# Patient Record
Sex: Male | Born: 1985 | Race: Black or African American | Hispanic: No | Marital: Single | Smoking: Never smoker
Health system: Southern US, Community
[De-identification: ages and names within clinical notes are randomized; demographics above are authoritative.]

---
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
## 2008-12-24 ENCOUNTER — Emergency Department (HOSPITAL_COMMUNITY): Admission: EM | Admit: 2008-12-24 | Discharge: 2008-12-24 | Payer: Self-pay | Admitting: Emergency Medicine

## 2009-03-14 ENCOUNTER — Emergency Department (HOSPITAL_COMMUNITY): Admission: EM | Admit: 2009-03-14 | Discharge: 2009-03-14 | Payer: Self-pay | Admitting: Emergency Medicine

## 2010-07-04 LAB — GC/CHLAMYDIA PROBE AMP, GENITAL
Chlamydia, DNA Probe: NEGATIVE
GC Probe Amp, Genital: NEGATIVE

## 2021-10-13 ENCOUNTER — Ambulatory Visit (HOSPITAL_COMMUNITY)
Admission: RE | Admit: 2021-10-13 | Discharge: 2021-10-13 | Disposition: A | Discharge status: Home / Self Care | Payer: Self-pay | Source: Ambulatory Visit | Attending: Internal Medicine | Admitting: Internal Medicine

## 2021-10-13 ENCOUNTER — Encounter (HOSPITAL_COMMUNITY): Payer: Self-pay

## 2021-10-13 VITALS — BP 120/73 | HR 71 | Temp 97.9°F | Resp 16 | Ht 74.0 in | Wt 260.0 lb

## 2021-10-13 DIAGNOSIS — Z113 Encounter for screening for infections with a predominantly sexual mode of transmission: Secondary | ICD-10-CM | POA: Insufficient documentation

## 2021-10-13 DIAGNOSIS — R369 Urethral discharge, unspecified: Secondary | ICD-10-CM | POA: Insufficient documentation

## 2021-10-13 NOTE — ED Triage Notes (Signed)
Patient c/o penile discharge x 2 days.  Patient denies dysuria, testicular pain, penile pain, or ABD pain.   Patient endorses "yellowish paste" discharge.   Patient endorses 1 new sexual partner.   Patient hasn't taken any medications for symptoms.

## 2021-10-13 NOTE — ED Provider Notes (Signed)
[facility]    CSN: [id number] Arrival date & time: 10/13/21  1048      History   Chief Complaint Chief Complaint  Patient presents with   Penile Discharge   APPT 1100    HPI Andrew Webster is a 36 y.o. male.   Patient presents with yellow penile discharge that has been present for 2 to 3 days.  Denies any associated urinary burning, urinary frequency, testicular pain, abdominal pain, fever.  He states that his new sexual partner has had similar symptoms.  Denies any confirmed exposure to STD.  Patient does not want testing for HIV or syphilis.   Penile Discharge    History reviewed. No pertinent past medical history.  There are no problems to display for this patient.   History reviewed. No pertinent surgical history.     Home Medications    Prior to Admission medications   Not on File    Family History No family history on file.  Social History Social History   Tobacco Use   Smoking status: Never   Smokeless tobacco: Never  Vaping Use   Vaping Use: Never used  Substance Use Topics   Alcohol use: Not Currently   Drug use: Never     Allergies   Patient has no known allergies.   Review of Systems Review of Systems Per HPI  Physical Exam Triage Vital Signs ED Triage Vitals  Enc Vitals Group     BP 10/13/21 1115 120/73     Pulse Rate 10/13/21 1115 71     Resp 10/13/21 1115 16     Temp 10/13/21 1115 97.9 F (36.6 C)     Temp Source 10/13/21 1115 Oral     SpO2 10/13/21 1115 99 %     Weight 10/13/21 1118 260 lb (117.9 kg)     Height 10/13/21 1118 6\' 2"  (1.88 m)     Head Circumference --      Peak Flow --      Pain Score 10/13/21 1118 0     Pain Loc --      Pain Edu? --      Excl. in GC? --    No data found.  Updated Vital Signs BP 120/73 (BP Location: Left Arm)   Pulse 71   Temp 97.9 F (36.6 C) (Oral)   Resp 16   Ht 6\' 2"  (1.88 m)   Wt 260 lb (117.9 kg)   SpO2 99%   BMI 33.38 kg/m   Visual Acuity Right Eye  Distance:   Left Eye Distance:   Bilateral Distance:    Right Eye Near:   Left Eye Near:    Bilateral Near:     Physical Exam Constitutional:      General: He is not in acute distress.    Appearance: Normal appearance. He is not toxic-appearing or diaphoretic.  HENT:     Head: Normocephalic and atraumatic.  Eyes:     Extraocular Movements: Extraocular movements intact.     Conjunctiva/sclera: Conjunctivae normal.  Pulmonary:     Effort: Pulmonary effort is normal.  Genitourinary:    Comments: Deferred with shared decision making.  Self swab performed. Neurological:     General: No focal deficit present.     Mental Status: He is alert and oriented to person, place, and time. Mental status is at baseline.  Psychiatric:        Mood and Affect: Mood normal.        Behavior: Behavior  normal.        Thought Content: Thought content normal.        Judgment: Judgment normal.      UC Treatments / Results  Labs (all labs ordered are listed, but only abnormal results are displayed) Labs Reviewed  CYTOLOGY, (ORAL, ANAL, URETHRAL) ANCILLARY ONLY    EKG   Radiology No results found.  Procedures Procedures (including critical care time)  Medications Ordered in UC Medications - No data to display  Initial Impression / Assessment and Plan / UC Course  I have reviewed the triage vital signs and the nursing notes.  Pertinent labs & imaging results that were available during my care of the patient were reviewed by me and considered in my medical decision making (see chart for details).     Cytology swab pending.  Due to no confirmed STD exposure, will await cytology swab results for treatment.  Patient to refrain from sexual activity until test results and treatment are complete.  Discussed return precautions.  Patient verbalized understanding and was agreeable with plan. Final Clinical Impressions(s) / UC Diagnoses   Final diagnoses:  Penile discharge  Screening  examination for venereal disease     Discharge Instructions      Your STD test is pending. We will call if results are positive and treat as appropriate. Please refrain from sexual activity until test results and treatment are complete.       ED Prescriptions   None    PDMP not reviewed this encounter.   Gustavus Bryant, [place] 10/13/21 1154

## 2021-10-13 NOTE — Discharge Instructions (Signed)
Your STD test is pending.  We will call if results are positive and treat as appropriate.  Please refrain from sexual activity until test results and treatment are complete. 

## 2021-10-17 ENCOUNTER — Ambulatory Visit (HOSPITAL_COMMUNITY)
Admission: EM | Admit: 2021-10-17 | Discharge: 2021-10-17 | Disposition: A | Discharge status: Home / Self Care | Payer: Self-pay

## 2021-10-18 LAB — CYTOLOGY, (ORAL, ANAL, URETHRAL) ANCILLARY ONLY
Chlamydia: NEGATIVE
Comment: NEGATIVE
Comment: NEGATIVE
Comment: NORMAL
Neisseria Gonorrhea: POSITIVE — AB
Trichomonas: POSITIVE — AB

## 2021-10-19 ENCOUNTER — Telehealth (HOSPITAL_COMMUNITY): Payer: Self-pay | Admitting: Emergency Medicine

## 2021-10-19 NOTE — Telephone Encounter (Signed)
OPened in error.

## 2023-05-23 ENCOUNTER — Emergency Department (HOSPITAL_COMMUNITY): Payer: Self-pay

## 2023-05-23 ENCOUNTER — Ambulatory Visit
Admission: EM | Admit: 2023-05-23 | Discharge: 2023-05-23 | Disposition: A | Discharge status: Home / Self Care | Payer: Self-pay | Attending: Family Medicine | Admitting: Family Medicine

## 2023-05-23 ENCOUNTER — Emergency Department (HOSPITAL_COMMUNITY)
Admission: EM | Admit: 2023-05-23 | Discharge: 2023-05-23 | Disposition: A | Discharge status: Home / Self Care | Payer: Self-pay | Attending: Emergency Medicine | Admitting: Emergency Medicine

## 2023-05-23 ENCOUNTER — Encounter (HOSPITAL_COMMUNITY): Payer: Self-pay

## 2023-05-23 ENCOUNTER — Other Ambulatory Visit: Payer: Self-pay

## 2023-05-23 DIAGNOSIS — S61452A Open bite of left hand, initial encounter: Secondary | ICD-10-CM

## 2023-05-23 DIAGNOSIS — S60413A Abrasion of left middle finger, initial encounter: Secondary | ICD-10-CM | POA: Insufficient documentation

## 2023-05-23 DIAGNOSIS — S60417A Abrasion of left little finger, initial encounter: Secondary | ICD-10-CM | POA: Insufficient documentation

## 2023-05-23 DIAGNOSIS — Z23 Encounter for immunization: Secondary | ICD-10-CM | POA: Insufficient documentation

## 2023-05-23 DIAGNOSIS — T148XXA Other injury of unspecified body region, initial encounter: Secondary | ICD-10-CM

## 2023-05-23 DIAGNOSIS — S80811A Abrasion, right lower leg, initial encounter: Secondary | ICD-10-CM | POA: Insufficient documentation

## 2023-05-23 DIAGNOSIS — R42 Dizziness and giddiness: Secondary | ICD-10-CM

## 2023-05-23 DIAGNOSIS — W540XXA Bitten by dog, initial encounter: Secondary | ICD-10-CM | POA: Insufficient documentation

## 2023-05-23 DIAGNOSIS — Y9301 Activity, walking, marching and hiking: Secondary | ICD-10-CM | POA: Insufficient documentation

## 2023-05-23 MED ORDER — RABIES IMMUNE GLOBULIN 150 UNIT/ML IM INJ
20.0000 [IU]/kg | INJECTION | Freq: Once | INTRAMUSCULAR | Status: DC
  Filled 2023-05-23: qty 16

## 2023-05-23 MED ORDER — AMOXICILLIN-POT CLAVULANATE 875-125 MG PO TABS
1.0000 | ORAL_TABLET | Freq: Once | ORAL | Status: AC
  Administered 2023-05-23: 1 via ORAL
  Filled 2023-05-23: qty 1

## 2023-05-23 MED ORDER — AMOXICILLIN-POT CLAVULANATE 875-125 MG PO TABS: 1.0000 | ORAL_TABLET | Freq: Two times a day (BID) | ORAL | 0 refills | Status: AC

## 2023-05-23 MED ORDER — LIDOCAINE-EPINEPHRINE (PF) 2 %-1:200000 IJ SOLN
20.0000 mL | Freq: Once | INTRAMUSCULAR | Status: AC
  Administered 2023-05-23: 4 mL
  Filled 2023-05-23: qty 20

## 2023-05-23 MED ORDER — HYDROCODONE-ACETAMINOPHEN 5-325 MG PO TABS: 1.0000 | ORAL_TABLET | Freq: Four times a day (QID) | ORAL | 0 refills | Status: AC | PRN

## 2023-05-23 MED ORDER — NAPROXEN 500 MG PO TABS: 500.0000 mg | ORAL_TABLET | Freq: Two times a day (BID) | ORAL | 0 refills | Status: AC

## 2023-05-23 MED ORDER — TETANUS-DIPHTH-ACELL PERTUSSIS 5-2.5-18.5 LF-MCG/0.5 IM SUSY
0.5000 mL | PREFILLED_SYRINGE | Freq: Once | INTRAMUSCULAR | Status: AC
  Administered 2023-05-23: 0.5 mL via INTRAMUSCULAR
  Filled 2023-05-23: qty 0.5

## 2023-05-23 MED ORDER — BACITRACIN ZINC 500 UNIT/GM EX OINT
TOPICAL_OINTMENT | Freq: Once | CUTANEOUS | Status: AC
  Administered 2023-05-23: 1 via TOPICAL

## 2023-05-23 MED ORDER — IBUPROFEN 800 MG PO TABS
800.0000 mg | ORAL_TABLET | Freq: Once | ORAL | Status: AC
  Administered 2023-05-23: 800 mg via ORAL
  Filled 2023-05-23: qty 1

## 2023-05-23 MED ORDER — ONDANSETRON 4 MG PO TBDP
4.0000 mg | ORAL_TABLET | Freq: Once | ORAL | Status: AC
  Administered 2023-05-23: 4 mg via ORAL
  Filled 2023-05-23: qty 1

## 2023-05-23 MED ORDER — MORPHINE SULFATE (PF) 4 MG/ML IV SOLN
4.0000 mg | Freq: Once | INTRAVENOUS | Status: AC
  Administered 2023-05-23: 4 mg via INTRAMUSCULAR
  Filled 2023-05-23: qty 1

## 2023-05-23 MED ORDER — RABIES VACCINE, PCEC IM SUSR
1.0000 mL | Freq: Once | INTRAMUSCULAR | Status: AC
  Administered 2023-05-23: 1 mL via INTRAMUSCULAR
  Filled 2023-05-23: qty 1

## 2023-05-23 NOTE — ED Triage Notes (Signed)
Pt arrives via EMS from [facility]. Pt was walking and a stray dog came up and bit him on his left and and right lower leg. Dressing is applied to left hand, appears that bleeding is controlled. Pt not utd on a tetanus shot.

## 2023-05-23 NOTE — ED Provider Notes (Signed)
Patient seen briefly at triage.  He sustained a dog bite to his left hand in right leg this morning earlier.  The dog is a stray.  On exam his blood pressure is 102/69 which is a good bit different from usual and he is having some dizziness at this time.  The hand has a bite wound on the palmar surface and on the dorsum of the left hand.  I cannot see if there is any tendon injury.  He is unable to flex and extend his fingers at this time.  There is a good bit of swelling of the hand.  There is also a bite wound on the anterior right lower leg.  With his deep hand wound, I think he needs evaluation in the emergency room.  His blood pressure is also soft, though I do not think he has had a copious amount of bleeding.  I discussed with him and his significant other going to the emergency room.  They are unable to arrive there on their own in a nonurgent ambulance is called for transport.     Zenia Resides, MD 05/23/23 [phone]

## 2023-05-23 NOTE — ED Notes (Signed)
Patient was told the effect of not taking the Rabies Immunoglobolin

## 2023-05-23 NOTE — ED Notes (Signed)
Patient is being discharged from the Urgent Care and sent to the Emergency Department via Ambulance (Non Emergent, Per request) . Per Dr. Rowland Lathe, patient is in need of higher level of care due to Dog Bite (extend/place of injury). Patient is aware and verbalizes understanding of plan of care.  Vitals:   05/23/23 1210  BP: 102/69  Pulse: (!) 105  Resp: 18  Temp: 97.9 F (36.6 C)  SpO2: 95%

## 2023-05-23 NOTE — ED Notes (Addendum)
Patient refuse rabies immune Globulin to be placed in his wound and he did not want in his muscles. Dr Denton Lank was told.

## 2023-05-23 NOTE — ED Provider Notes (Signed)
 [place] EMERGENCY DEPARTMENT AT [facility] Provider Note   CSN: [id number] Arrival date & time: 05/23/23  1258     History  Chief Complaint  Patient presents with   Animal Bite    Andrew Webster is a 38 y.o. male.  Andrew Webster is a 38 y.o. male who is otherwise healthy, presents to the ED from urgent care for evaluation of a dog bite.  Patient reports bite wounds to his left hand and right lower leg.  He reports he was walking down the street with his girlfriend when a stray dog came up as he tried to shoot the dog away it bit his right lower leg and then bit onto his left hand and would not let go as he tried to pull his hand away.  Sent from urgent care for further evaluation of bite wounds.  Unsure of last tetanus vaccination and they do not know of the dog's rabies status as there was no owner or other people nearby.   Animal Bite Associated symptoms: no fever        Home Medications Prior to Admission medications   Medication Sig Start Date End Date Taking? Authorizing Provider  amoxicillin-clavulanate (AUGMENTIN) 875-125 MG tablet Take 1 tablet by mouth every 12 (twelve) hours. 05/23/23  Yes Rosezella Rumpf, PA-C  HYDROcodone-acetaminophen (NORCO/VICODIN) 5-325 MG tablet Take 1 tablet by mouth every 6 (six) hours as needed. 05/23/23  Yes Rosezella Rumpf, PA-C  naproxen (NAPROSYN) 500 MG tablet Take 1 tablet (500 mg total) by mouth 2 (two) times daily. 05/23/23  Yes Rosezella Rumpf, PA-C      Allergies    Patient has no known allergies.    Review of Systems   Review of Systems  Constitutional:  Negative for chills and fever.  Skin:  Positive for wound.    Physical Exam Updated Vital Signs BP 128/89 (BP Location: Right Arm)   Pulse 82   Temp 98.4 F (36.9 C) (Oral)   Resp 16   SpO2 100%  Physical Exam Vitals and nursing note reviewed.  Constitutional:      General: He is not in acute distress.    Appearance: Normal appearance. He is  well-developed. He is not ill-appearing or diaphoretic.  HENT:     Head: Normocephalic and atraumatic.  Eyes:     General:        Right eye: No discharge.        Left eye: No discharge.  Pulmonary:     Effort: Pulmonary effort is normal. No respiratory distress.  Musculoskeletal:     Comments: Left hand with 1-1/2 cm jagged laceration to the center of the palm with some subcutaneous tissue visible but no visible tendon.  Patient with 1 cm laceration to the dorsum of the hand in between the third and fourth MCPs.  Some small abrasions to the third and fourth fingers.  Patient has some swelling to the hand reducing range of motion but he is able to move all fingers has normal cap refill in all fingers with 2+ radial pulses and sensation intact throughout the hand.  On the posterior medial right calf patient has a small abrasion this does not appear to be a puncture wound there is no surrounding tenderness or redness.  Neurological:     Mental Status: He is alert and oriented to person, place, and time.     Coordination: Coordination normal.  Psychiatric:        Mood  and Affect: Mood normal.        Behavior: Behavior normal.    ED Results / Procedures / Treatments   Labs (all labs ordered are listed, but only abnormal results are displayed) Labs Reviewed - No data to display  EKG None  Radiology DG Tibia/Fibula Right Result Date: 05/23/2023 CLINICAL DATA:  Right lower leg pain after dog bite. EXAM: RIGHT TIBIA AND FIBULA - 2 VIEW COMPARISON:  None Available. FINDINGS: There is no evidence of fracture or other focal bone lesions. Soft tissues are unremarkable. IMPRESSION: Negative. Electronically Signed   By: Obie Dredge M.D.   On: 05/23/2023 14:18   DG Hand Complete Left Result Date: 05/23/2023 CLINICAL DATA:  Left hand pain after dog bite. EXAM: LEFT HAND - COMPLETE 3+ VIEW COMPARISON:  None Available. FINDINGS: There is no evidence of fracture or dislocation. There is no evidence  of arthropathy or other focal bone abnormality. Mild diffuse soft tissue swelling. No radiopaque foreign body identified. IMPRESSION: 1. Mild diffuse soft tissue swelling. No acute osseous abnormality or radiopaque foreign body. Electronically Signed   By: Obie Dredge M.D.   On: 05/23/2023 14:17    Procedures Irrigation  Date/Time: 05/29/2023 7:59 PM  Performed by: Rosezella Rumpf, PA-C Authorized by: Rosezella Rumpf, PA-C  Consent: Verbal consent obtained. Risks and benefits: risks, benefits and alternatives were discussed Consent given by: patient Patient understanding: patient states understanding of the procedure being performed Patient identity confirmed: verbally with patient Local anesthesia used: yes Anesthesia: local infiltration  Anesthesia: Local anesthesia used: yes Local Anesthetic: lidocaine 2% with epinephrine  Sedation: Patient sedated: no  Patient tolerance: patient tolerated the procedure well with no immediate complications Comments: Dog bite wounds to the dorsum and palmar surface of the left hand irrigated with 2 L of normal saline full depth of wound explored without evidence of tendon involvement       Medications Ordered in ED Medications  rabies immune globulin (HYPERRAB/KEDRAB) injection 2,325 Units (has no administration in time range)  rabies vaccine (RABAVERT) injection 1 mL (has no administration in time range)  ondansetron (ZOFRAN-ODT) disintegrating tablet 4 mg (4 mg Oral Given 05/23/23 1442)  morphine (PF) 4 MG/ML injection 4 mg (4 mg Intramuscular Given 05/23/23 1443)  lidocaine-EPINEPHrine (XYLOCAINE W/EPI) 2 %-1:200000 (PF) injection 20 mL (4 mLs Infiltration Given 05/23/23 1514)  Tdap (BOOSTRIX) injection 0.5 mL (0.5 mLs Intramuscular Given 05/23/23 1546)  amoxicillin-clavulanate (AUGMENTIN) 875-125 MG per tablet 1 tablet (1 tablet Oral Given 05/23/23 1544)  ibuprofen (ADVIL) tablet 800 mg (800 mg Oral Given 05/23/23 1544)  bacitracin  ointment (1 Application Topical Given 05/23/23 1527)    ED Course/ Medical Decision Making/ A&P                                 Medical Decision Making Amount and/or Complexity of Data Reviewed Radiology: ordered.  Risk OTC drugs. Prescription drug management.   38 year old male with dog bite to the left hand and abrasion to the right calf.  X-rays of the hand and tib-fib obtained and viewed and interpreted without evidence of fracture or foreign body.  Wounds were copiously irrigated cleaned and explored without evidence of tendon involvement.  Given that wounds were small and at high risk for infection they were not sutured closed and will be allowed to heal by secondary intention.  Bacitracin and dressings applied to the hand and right calf.  Ordered tetanus  and rabies vaccination as well as rabies immunoglobulin and will place patient on Augmentin for infectious prophylaxis from dog bite.  Discussed NSAIDs for pain management and short course of hydrocodone as needed for severe breakthrough pain cautioned patient that this medication can cause drowsiness.  Discussed strict return precautions with the patient and provided with letter explaining vaccination schedule to complete rabies series.  Patient and significant other expressed understanding and agreement with plan.  At this time there does not appear to be any evidence of an acute emergency medical condition requiring further emergent evaluation and the patient appears stable for discharge with appropriate outpatient follow up. Diagnosis and return precautions discussed with patient who verbalizes understanding and is agreeable to discharge.           Final Clinical Impression(s) / ED Diagnoses Final diagnoses:  Animal bite    Rx / DC Orders ED Discharge Orders          Ordered    amoxicillin-clavulanate (AUGMENTIN) 875-125 MG tablet  Every 12 hours        05/23/23 1538    naproxen (NAPROSYN) 500 MG tablet  2 times  daily        05/23/23 1538    HYDROcodone-acetaminophen (NORCO/VICODIN) 5-325 MG tablet  Every 6 hours PRN        05/23/23 1538              Velda Shell 05/29/23 2003    Terrilee Files, MD 05/30/23 1010

## 2023-05-23 NOTE — Discharge Instructions (Addendum)
Your x-rays did not show any fractures or foreign body.  Keep wounds wrapped and covered.  Wash with plain soap and water or saline wound spray at least once a day and then recover. Do not use hydrogen peroxide or alcohol over the wounds.  To help with swelling elevate the hand and apply ice for 20 minutes at a time.  If you notice significantly worsened swelling redness or drainage from the wounds you should return to the emergency department for further evaluation.  Dog bites are at high risk for infection.  Take Augmentin twice daily for the next 7 days.  To treat pain use Naprosyn twice daily, this is a pain and anti-inflammatory medication.  For severe breakthrough pain you can use prescribed hydrocodone 1 tablet every 6 hours.  Do not drive while using this medication.  You will need to follow-up at urgent care to complete the rest of your rabies vaccination series please reference the letter on your paperwork today that shows the dates that you need to follow-up for additional vaccinations.  Your tetanus vaccine was also updated today.

## 2023-05-23 NOTE — ED Triage Notes (Signed)
"  We were walking up [facility] going toward [place] and dog coming from opposite direction approached and attacked him". "Bites on left hand and lower left leg". Multiple bites/punctures.

## 2023-05-24 NOTE — Discharge Instructions (Signed)
Pt was sent to ER by transport for further evaluation of deep tissue damage of left hand by dog bite, plus soft BP and dizziness. Pt did not have private vehicle for self transport

## 2023-07-02 DEATH — deceased
